# Patient Record
Sex: Female | Born: 1975 | Hispanic: Yes | Marital: Single | Smoking: Never smoker
Health system: Southern US, Community
[De-identification: ages and names within clinical notes are randomized; demographics above are authoritative.]

## PROBLEM LIST (undated history)

## (undated) DIAGNOSIS — R87629 Unspecified abnormal cytological findings in specimens from vagina: Secondary | ICD-10-CM

## (undated) DIAGNOSIS — D649 Anemia, unspecified: Secondary | ICD-10-CM

## (undated) DIAGNOSIS — O24419 Gestational diabetes mellitus in pregnancy, unspecified control: Secondary | ICD-10-CM

## (undated) DIAGNOSIS — Z8659 Personal history of other mental and behavioral disorders: Secondary | ICD-10-CM

## (undated) HISTORY — DX: Personal history of other mental and behavioral disorders: Z86.59

## (undated) HISTORY — DX: Unspecified abnormal cytological findings in specimens from vagina: R87.629

## (undated) HISTORY — DX: Gestational diabetes mellitus in pregnancy, unspecified control: O24.419

## (undated) HISTORY — DX: Anemia, unspecified: D64.9

---

## 2004-04-29 ENCOUNTER — Emergency Department: Payer: Self-pay | Admitting: Emergency Medicine

## 2006-07-19 DIAGNOSIS — O321XX0 Maternal care for breech presentation, not applicable or unspecified: Secondary | ICD-10-CM

## 2009-01-18 LAB — HM HIV SCREENING LAB: HM HIV Screening: NEGATIVE

## 2015-08-09 LAB — HM PAP SMEAR: HM Pap smear: NEGATIVE

## 2019-01-04 ENCOUNTER — Encounter: Payer: Self-pay | Admitting: Emergency Medicine

## 2019-01-04 ENCOUNTER — Emergency Department: Payer: Self-pay

## 2019-01-04 ENCOUNTER — Emergency Department
Admission: EM | Admit: 2019-01-04 | Discharge: 2019-01-04 | Disposition: A | Discharge status: Home / Self Care | Payer: Self-pay | Attending: Emergency Medicine | Admitting: Emergency Medicine

## 2019-01-04 DIAGNOSIS — D649 Anemia, unspecified: Secondary | ICD-10-CM | POA: Insufficient documentation

## 2019-01-04 DIAGNOSIS — E876 Hypokalemia: Secondary | ICD-10-CM | POA: Insufficient documentation

## 2019-01-04 DIAGNOSIS — F410 Panic disorder [episodic paroxysmal anxiety] without agoraphobia: Secondary | ICD-10-CM | POA: Insufficient documentation

## 2019-01-04 LAB — BASIC METABOLIC PANEL
Anion gap: 9 (ref 5–15)
BUN: 12 mg/dL (ref 6–20)
CO2: 24 mmol/L (ref 22–32)
Calcium: 8.7 mg/dL — ABNORMAL LOW (ref 8.9–10.3)
Chloride: 105 mmol/L (ref 98–111)
Creatinine, Ser: 0.53 mg/dL (ref 0.44–1.00)
GFR calc Af Amer: >60 mL/min (ref >60)
GFR calc non Af Amer: >60 mL/min (ref >60)
Glucose, Bld: 128 mg/dL — ABNORMAL HIGH (ref 70–99)
Potassium: 3 mmol/L — ABNORMAL LOW (ref 3.5–5.1)
Sodium: 138 mmol/L (ref 135–145)

## 2019-01-04 LAB — CBC
HCT: 34.6 % — ABNORMAL LOW (ref 36.0–46.0)
Hemoglobin: 11.1 g/dL — ABNORMAL LOW (ref 12.0–15.0)
MCH: 27.3 pg (ref 26.0–34.0)
MCHC: 32.1 g/dL (ref 30.0–36.0)
MCV: 85 fL (ref 80.0–100.0)
Platelets: 313 10*3/uL (ref 150–400)
RBC: 4.07 MIL/uL (ref 3.87–5.11)
RDW: 15.8 % — ABNORMAL HIGH (ref 11.5–15.5)
WBC: 6.1 10*3/uL (ref 4.0–10.5)
nRBC: 0 % (ref 0.0–0.2)

## 2019-01-04 LAB — TROPONIN I (HIGH SENSITIVITY): Troponin I (High Sensitivity): 3 ng/L (ref <18)

## 2019-01-04 MED ORDER — SODIUM CHLORIDE 0.9% FLUSH: 3.0000 mL | Freq: Once | INTRAVENOUS | Status: DC

## 2019-01-04 MED ORDER — IRON 325 (65 FE) MG PO TABS: 1.0000 | ORAL_TABLET | Freq: Every day | ORAL | 1 refills | Status: DC

## 2019-01-04 MED ORDER — POTASSIUM CHLORIDE CRYS ER 20 MEQ PO TBCR
40.0000 meq | EXTENDED_RELEASE_TABLET | Freq: Once | ORAL | Status: AC
  Administered 2019-01-04: 08:00:00 40 meq via ORAL
  Filled 2019-01-04: qty 2
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
## 2019-01-04 NOTE — ED Notes (Signed)
Dr. Veronese at bedside 

## 2019-01-04 NOTE — ED Triage Notes (Signed)
Via the Ipad Spanish interpretor, the patient presents to ED for chest pain located in the central chest without radiation. Says she feels like she can't breathe well. States this happens when she gets upset. When asked if she got upset tonight she stated yes and she is having a little trouble with her partner. Patient denies SI or HI. Patient is without obvious respiratory distress. Able to speak in complete sentences without difficulty. Patient also stated that the spanish interpretor via iPad had "too much Spanish."
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
## 2019-07-06 ENCOUNTER — Ambulatory Visit (LOCAL_COMMUNITY_HEALTH_CENTER): Payer: Self-pay

## 2019-07-06 ENCOUNTER — Other Ambulatory Visit: Payer: Self-pay

## 2019-07-06 VITALS — BP 107/71 | Ht 65.0 in | Wt 157.0 lb

## 2019-07-06 DIAGNOSIS — Z3201 Encounter for pregnancy test, result positive: Secondary | ICD-10-CM

## 2019-07-06 MED ORDER — PRENATAL VITAMIN 27-0.8 MG PO TABS: 1.0000 | ORAL_TABLET | Freq: Every day | ORAL | 0 refills | Status: DC
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
## 2019-07-07 LAB — PREGNANCY, URINE: Preg Test, Ur: POSITIVE — AB

## 2019-08-12 NOTE — Progress Notes (Signed)
Record abstracted per 08/10/19 phone interview Hal Hope, RN; Sharlette Dense, RN

## 2019-08-14 ENCOUNTER — Ambulatory Visit: Payer: Medicaid Other | Admitting: Physician Assistant

## 2019-08-14 ENCOUNTER — Encounter: Payer: Self-pay | Admitting: Physician Assistant

## 2019-08-14 ENCOUNTER — Other Ambulatory Visit: Payer: Self-pay

## 2019-08-14 VITALS — BP 104/65 | HR 75 | Temp 98.6°F | Wt 158.6 lb

## 2019-08-14 DIAGNOSIS — O0990 Supervision of high risk pregnancy, unspecified, unspecified trimester: Secondary | ICD-10-CM | POA: Diagnosis not present

## 2019-08-14 DIAGNOSIS — O09529 Supervision of elderly multigravida, unspecified trimester: Secondary | ICD-10-CM | POA: Insufficient documentation

## 2019-08-14 DIAGNOSIS — Z98891 History of uterine scar from previous surgery: Secondary | ICD-10-CM | POA: Insufficient documentation

## 2019-08-14 DIAGNOSIS — B9689 Other specified bacterial agents as the cause of diseases classified elsewhere: Secondary | ICD-10-CM

## 2019-08-14 DIAGNOSIS — E663 Overweight: Secondary | ICD-10-CM | POA: Insufficient documentation

## 2019-08-14 DIAGNOSIS — O0932 Supervision of pregnancy with insufficient antenatal care, second trimester: Secondary | ICD-10-CM | POA: Insufficient documentation

## 2019-08-14 DIAGNOSIS — Z9189 Other specified personal risk factors, not elsewhere classified: Secondary | ICD-10-CM

## 2019-08-14 DIAGNOSIS — R87629 Unspecified abnormal cytological findings in specimens from vagina: Secondary | ICD-10-CM

## 2019-08-14 DIAGNOSIS — N760 Acute vaginitis: Secondary | ICD-10-CM

## 2019-08-14 DIAGNOSIS — K029 Dental caries, unspecified: Secondary | ICD-10-CM

## 2019-08-14 LAB — WET PREP FOR TRICH, YEAST, CLUE
Trichomonas Exam: NEGATIVE
Yeast Exam: NEGATIVE

## 2019-08-14 LAB — URINALYSIS
Bilirubin, UA: NEGATIVE
Glucose, UA: NEGATIVE
Leukocytes,UA: NEGATIVE
Nitrite, UA: NEGATIVE
Specific Gravity, UA: 1.03 (ref 1.005–1.030)
Urobilinogen, Ur: 0.2 mg/dL (ref 0.2–1.0)
pH, UA: 6 (ref 5.0–7.5)

## 2019-08-14 LAB — HEMOGLOBIN, FINGERSTICK: Hemoglobin: 11.3 g/dL (ref 11.1–15.9)

## 2019-08-14 MED ORDER — METRONIDAZOLE 250 MG PO TABS: 250.0000 mg | ORAL_TABLET | Freq: Three times a day (TID) | ORAL | 0 refills | Status: AC
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
## 2019-08-14 NOTE — Progress Notes (Signed)
Patient wet mount reviewed with provider, patient treated for BV per SO. Patient declines flu vaccine at this time, states she thinks she had it last fall. ROI for C-section Operative notes from 2008 faxed to [facility], and [facility] U/S referral faxed, both with confirmation. Declined quad screen, declination signed.Burt Knack, RN

## 2019-08-15 LAB — CBC/D/PLT+RPR+RH+ABO+RUB AB...
Antibody Screen: NEGATIVE
Basophils Absolute: 0 10*3/uL (ref 0.0–0.2)
Basos: 0 %
EOS (ABSOLUTE): 0.1 10*3/uL (ref 0.0–0.4)
Eos: 1 %
Hematocrit: 34.1 % (ref 34.0–46.6)
Hemoglobin: 11.4 g/dL (ref 11.1–15.9)
Hepatitis B Surface Ag: NEGATIVE
Immature Grans (Abs): 0 10*3/uL (ref 0.0–0.1)
Immature Granulocytes: 0 %
Lymphocytes Absolute: 1.4 10*3/uL (ref 0.7–3.1)
Lymphs: 17 %
MCH: 28.9 pg (ref 26.6–33.0)
MCHC: 33.4 g/dL (ref 31.5–35.7)
MCV: 86 fL (ref 79–97)
Monocytes Absolute: 0.6 10*3/uL (ref 0.1–0.9)
Monocytes: 7 %
Neutrophils Absolute: 6.3 10*3/uL (ref 1.4–7.0)
Neutrophils: 75 %
Platelets: 348 10*3/uL (ref 150–450)
RBC: 3.95 x10E6/uL (ref 3.77–5.28)
RDW: 14.9 % (ref 11.7–15.4)
RPR Ser Ql: NONREACTIVE
Rh Factor: POSITIVE
Rubella Antibodies, IGG: <0.9 index — ABNORMAL LOW (ref >0.99)
Varicella zoster IgG: 329 index (ref >165)
WBC: 8.4 10*3/uL (ref 3.4–10.8)

## 2019-08-15 LAB — HCV AB W REFLEX TO QUANT PCR: HCV Ab: <0.1 s/co ratio (ref 0.0–0.9)

## 2019-08-15 LAB — HIV ANTIBODY (ROUTINE TESTING W REFLEX): HIV Screen 4th Generation wRfx: NONREACTIVE

## 2019-08-15 LAB — HCV INTERPRETATION

## 2019-08-16 LAB — CHLAMYDIA/GC NAA, CONFIRMATION
Chlamydia trachomatis, NAA: NEGATIVE
Neisseria gonorrhoeae, NAA: NEGATIVE

## 2019-08-16 LAB — URINE CULTURE: Organism ID, Bacteria: NO GROWTH

## 2019-08-17 DIAGNOSIS — O09899 Supervision of other high risk pregnancies, unspecified trimester: Secondary | ICD-10-CM | POA: Insufficient documentation

## 2019-08-17 LAB — IGP, APTIMA HPV
HPV Aptima: NEGATIVE
PAP Smear Comment: 0

## 2019-08-17 LAB — HGB FRACTIONATION CASCADE
Hgb A2: 2.6 % (ref 1.8–3.2)
Hgb A: 97.4 % (ref 96.4–98.8)
Hgb F: 0 % (ref 0.0–2.0)
Hgb S: 0 %

## 2019-08-17 LAB — LEAD, BLOOD (ADULT >= 16 YRS): Lead-Whole Blood: <1 ug/dL (ref 0–4)

## 2019-08-18 LAB — QUANTIFERON-TB GOLD PLUS
QuantiFERON Mitogen Value: >10 IU/mL
QuantiFERON Nil Value: 0 IU/mL
QuantiFERON TB1 Ag Value: 0 IU/mL
QuantiFERON TB2 Ag Value: 0 IU/mL
QuantiFERON-TB Gold Plus: NEGATIVE

## 2019-09-04 ENCOUNTER — Encounter: Payer: Self-pay | Admitting: Advanced Practice Midwife

## 2019-09-11 ENCOUNTER — Encounter: Payer: Self-pay | Admitting: Family Medicine

## 2019-09-11 ENCOUNTER — Ambulatory Visit: Payer: Medicaid Other | Admitting: Family Medicine

## 2019-09-11 ENCOUNTER — Other Ambulatory Visit: Payer: Self-pay

## 2019-09-11 VITALS — BP 113/64 | HR 70 | Temp 97.6°F | Wt 167.4 lb

## 2019-09-11 DIAGNOSIS — Z283 Underimmunization status: Secondary | ICD-10-CM

## 2019-09-11 DIAGNOSIS — E663 Overweight: Secondary | ICD-10-CM

## 2019-09-11 DIAGNOSIS — O0990 Supervision of high risk pregnancy, unspecified, unspecified trimester: Secondary | ICD-10-CM

## 2019-09-11 DIAGNOSIS — O09529 Supervision of elderly multigravida, unspecified trimester: Secondary | ICD-10-CM | POA: Diagnosis not present

## 2019-09-11 DIAGNOSIS — Z9189 Other specified personal risk factors, not elsewhere classified: Secondary | ICD-10-CM

## 2019-09-11 DIAGNOSIS — Z98891 History of uterine scar from previous surgery: Secondary | ICD-10-CM

## 2019-09-11 DIAGNOSIS — O99891 Other specified diseases and conditions complicating pregnancy: Secondary | ICD-10-CM

## 2019-09-11 DIAGNOSIS — N898 Other specified noninflammatory disorders of vagina: Secondary | ICD-10-CM

## 2019-09-11 DIAGNOSIS — Z2839 Other underimmunization status: Secondary | ICD-10-CM

## 2019-09-11 LAB — WET PREP FOR TRICH, YEAST, CLUE: Trichomonas Exam: NEGATIVE

## 2019-09-11 MED ORDER — CLOTRIMAZOLE 1 % VA CREA: 1.0000 | TOPICAL_CREAM | Freq: Every day | VAGINAL | 0 refills | Status: AC

## 2019-09-11 MED ORDER — PRENATAL VITAMINS 28-0.8 MG PO TABS: 1.0000 | ORAL_TABLET | Freq: Every day | ORAL | 0 refills | Status: DC
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
## 2019-09-11 NOTE — Progress Notes (Signed)
In for visit ;taking PNV (more given today); aware of 09/15/19 [facility] u/s; [facility] pager# given; counseled re: rubella non-immune & vaccine pp; denies hospital visit since last appt; OBCM R. Marlan Palau, in to see client Sharlette Dense, RN Wet prep reviewed-+Yeast treated per standing order Sharlette Dense, RN

## 2019-09-16 LAB — CHLAMYDIA/GC NAA, CONFIRMATION
Chlamydia trachomatis, NAA: NEGATIVE
Neisseria gonorrhoeae, NAA: NEGATIVE

## 2019-10-09 ENCOUNTER — Encounter: Payer: Self-pay | Admitting: Family Medicine

## 2019-10-09 ENCOUNTER — Other Ambulatory Visit: Payer: Self-pay

## 2019-10-09 ENCOUNTER — Ambulatory Visit: Payer: Self-pay | Admitting: Family Medicine

## 2019-10-09 VITALS — BP 98/60 | HR 77 | Temp 97.7°F | Wt 172.8 lb

## 2019-10-09 DIAGNOSIS — Z98891 History of uterine scar from previous surgery: Secondary | ICD-10-CM

## 2019-10-09 DIAGNOSIS — Z283 Underimmunization status: Secondary | ICD-10-CM

## 2019-10-09 DIAGNOSIS — O0990 Supervision of high risk pregnancy, unspecified, unspecified trimester: Secondary | ICD-10-CM

## 2019-10-09 DIAGNOSIS — O99891 Other specified diseases and conditions complicating pregnancy: Secondary | ICD-10-CM

## 2019-10-09 DIAGNOSIS — O0932 Supervision of pregnancy with insufficient antenatal care, second trimester: Secondary | ICD-10-CM

## 2019-10-09 DIAGNOSIS — Z2839 Other underimmunization status: Secondary | ICD-10-CM

## 2019-10-09 DIAGNOSIS — O09529 Supervision of elderly multigravida, unspecified trimester: Secondary | ICD-10-CM
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
## 2019-10-09 NOTE — Progress Notes (Signed)
In for visit; taking PNV; denies hospital visits since last appt; discussed labs needed @ next visit Sharlette Dense, RN

## 2019-11-06 ENCOUNTER — Ambulatory Visit: Payer: Self-pay

## 2019-11-12 ENCOUNTER — Other Ambulatory Visit: Payer: Self-pay

## 2019-11-12 ENCOUNTER — Ambulatory Visit: Payer: Self-pay | Admitting: Physician Assistant

## 2019-11-12 ENCOUNTER — Encounter: Payer: Self-pay | Admitting: Physician Assistant

## 2019-11-12 VITALS — BP 105/64 | HR 77 | Temp 97.9°F | Wt 183.2 lb

## 2019-11-12 DIAGNOSIS — E663 Overweight: Secondary | ICD-10-CM

## 2019-11-12 DIAGNOSIS — Z98891 History of uterine scar from previous surgery: Secondary | ICD-10-CM

## 2019-11-12 DIAGNOSIS — O0990 Supervision of high risk pregnancy, unspecified, unspecified trimester: Secondary | ICD-10-CM

## 2019-11-12 NOTE — Progress Notes (Signed)
Pt denies visit to ER since last appt at [facility]. Pt is taking PNV. Pt refuses Tdap vaccine; pt accepts Tdap information sheet. Reviewed kick counts and card provided. Pt completed CCNC and PHQ9 forms. List of pediatricians provided to pt. Pt scheduled for 28 week labs in RN clinic for 11/16/19.
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
## 2019-11-16 ENCOUNTER — Other Ambulatory Visit: Payer: Self-pay

## 2019-11-16 DIAGNOSIS — O0990 Supervision of high risk pregnancy, unspecified, unspecified trimester: Secondary | ICD-10-CM

## 2019-11-16 MED ORDER — FERROUS SULFATE 324 (65 FE) MG PO TBEC: 324.0000 mg | DELAYED_RELEASE_TABLET | Freq: Three times a day (TID) | ORAL | 0 refills | Status: DC

## 2019-11-16 NOTE — Progress Notes (Signed)
Patient here for 28 weeks labs. Draw time for 1hr glucose is 4:28pm Richmond Campbell, RN   Hgb reveiwed; anemia profile ordered and patient given Iron 325mg  1 po TID per SO. Instructed to take with OJ and 2 hours apart from PNV.  Co also to increase fluids and fier d/t possible constipation. Patient verbalized understanding , RN

## 2019-11-17 ENCOUNTER — Telehealth: Payer: Self-pay

## 2019-11-17 DIAGNOSIS — O24419 Gestational diabetes mellitus in pregnancy, unspecified control: Secondary | ICD-10-CM | POA: Insufficient documentation

## 2019-11-17 DIAGNOSIS — O99013 Anemia complicating pregnancy, third trimester: Secondary | ICD-10-CM | POA: Insufficient documentation

## 2019-11-17 LAB — FE+CBC/D/PLT+TIBC+FER+RETIC
Basophils Absolute: 0 10*3/uL (ref 0.0–0.2)
Basos: 0 %
EOS (ABSOLUTE): 0.1 10*3/uL (ref 0.0–0.4)
Eos: 1 %
Ferritin: 6 ng/mL — ABNORMAL LOW (ref 15–150)
Hematocrit: 27.7 % — ABNORMAL LOW (ref 34.0–46.6)
Hemoglobin: 9.3 g/dL — ABNORMAL LOW (ref 11.1–15.9)
Immature Grans (Abs): 0 10*3/uL (ref 0.0–0.1)
Immature Granulocytes: 1 %
Iron Saturation: 3 % — CL (ref 15–55)
Iron: 15 ug/dL — ABNORMAL LOW (ref 27–159)
Lymphocytes Absolute: 1.2 10*3/uL (ref 0.7–3.1)
Lymphs: 18 %
MCH: 28.6 pg (ref 26.6–33.0)
MCHC: 33.6 g/dL (ref 31.5–35.7)
MCV: 85 fL (ref 79–97)
Monocytes Absolute: 0.3 10*3/uL (ref 0.1–0.9)
Monocytes: 5 %
Neutrophils Absolute: 4.8 10*3/uL (ref 1.4–7.0)
Neutrophils: 75 %
Platelets: 319 10*3/uL (ref 150–450)
RBC: 3.25 x10E6/uL — ABNORMAL LOW (ref 3.77–5.28)
RDW: 13.8 % (ref 11.7–15.4)
Retic Ct Pct: 2.2 % (ref 0.6–2.6)
Total Iron Binding Capacity: 558 ug/dL (ref 250–450)
UIBC: 543 ug/dL — ABNORMAL HIGH (ref 131–425)
WBC: 6.4 10*3/uL (ref 3.4–10.8)

## 2019-11-17 LAB — RPR: RPR Ser Ql: NONREACTIVE

## 2019-11-17 LAB — HIV ANTIBODY (ROUTINE TESTING W REFLEX): HIV Screen 4th Generation wRfx: NONREACTIVE

## 2019-11-17 LAB — HEMOGLOBIN: Hemoglobin: 9.3 g/dL — ABNORMAL LOW (ref 11.1–15.9)

## 2019-11-17 LAB — GLUCOSE, 1 HOUR GESTATIONAL: Gestational Diabetes Screen: 182 mg/dL — ABNORMAL HIGH (ref 65–139)

## 2019-11-17 NOTE — Telephone Encounter (Signed)
One hour glucola test result = 182 and client needs 3 hour GTT. Call to client with [facility] [employer] (ID # [phone]) and left message need to discuss diabetes testing result and need for additional testing. Number to call provided. Jossie Ng, RN

## 2019-11-18 NOTE — Telephone Encounter (Signed)
TC to patient to inform of elevated 1 hour gtt and to scheduled 3 hour gtt. LM for patient to return call asap. Interpreter V. Olmedo.Burt Knack, RN

## 2019-11-19 ENCOUNTER — Telehealth: Payer: Self-pay

## 2019-11-19 NOTE — Telephone Encounter (Signed)
Patient returned call and counseled on need for 3 hr gtt. Due to elevated 1 hr gtt. Patient counseled on fasting for 12 hours before test and when to stop eating and when to arrive for test. Patient scheduled for 11/20/2019 and told to arrive at 8:00am. Patient counseled she will need to be here for most of the morning for the scheduled blood draws 1 hour apart. Patient states understanding about fasting and time required for test. Patient questions answered and patient states understanding of plan. Interpreter V. Olmedo.Burt Knack, RN

## 2019-11-20 ENCOUNTER — Other Ambulatory Visit: Payer: Self-pay

## 2019-11-20 DIAGNOSIS — O99810 Abnormal glucose complicating pregnancy: Secondary | ICD-10-CM

## 2019-11-20 NOTE — Progress Notes (Signed)
Patient in nurse clinic today for 3 hour GTT. Instructions given, patient sent to lab. No history of travel noted. Questions answered. Reports understanding. M. Yemen, interpreter today. Jerel Shepherd, RN

## 2019-11-21 LAB — GLUCOSE TOLERANCE TEST, 6 HOUR
Glucose, 1 Hour GTT: 182 mg/dL (ref 65–199)
Glucose, 2 hour: 155 mg/dL — ABNORMAL HIGH (ref 65–139)
Glucose, 3 hour: 114 mg/dL — ABNORMAL HIGH (ref 65–109)
Glucose, GTT - Fasting: 81 mg/dL (ref 65–99)

## 2019-11-23 ENCOUNTER — Telehealth: Payer: Self-pay | Admitting: Family Medicine

## 2019-11-23 ENCOUNTER — Encounter: Payer: Self-pay | Admitting: Family Medicine

## 2019-11-23 DIAGNOSIS — O24410 Gestational diabetes mellitus in pregnancy, diet controlled: Secondary | ICD-10-CM

## 2019-11-23 MED ORDER — BLOOD GLUCOSE METER KIT: PACK | 0 refills | Status: DC

## 2019-11-23 NOTE — Telephone Encounter (Addendum)
I left a message on pt's phone to inform her of new dx of GDM. I have referred pt to [facility] for GDM education and glucometer.  Pt has been added to "OB Pt list." Glucometer kit ordered, plan for [facility] to provide.

## 2019-11-26 ENCOUNTER — Other Ambulatory Visit: Payer: Self-pay

## 2019-11-26 ENCOUNTER — Encounter: Payer: Self-pay | Admitting: Physician Assistant

## 2019-11-26 ENCOUNTER — Ambulatory Visit: Payer: Self-pay | Admitting: Physician Assistant

## 2019-11-26 VITALS — BP 111/68 | HR 81 | Temp 97.1°F | Wt 182.6 lb

## 2019-11-26 DIAGNOSIS — O09899 Supervision of other high risk pregnancies, unspecified trimester: Secondary | ICD-10-CM

## 2019-11-26 DIAGNOSIS — O24410 Gestational diabetes mellitus in pregnancy, diet controlled: Secondary | ICD-10-CM

## 2019-11-26 DIAGNOSIS — O99891 Other specified diseases and conditions complicating pregnancy: Secondary | ICD-10-CM

## 2019-11-26 DIAGNOSIS — Z283 Underimmunization status: Secondary | ICD-10-CM

## 2019-11-26 DIAGNOSIS — Z3490 Encounter for supervision of normal pregnancy, unspecified, unspecified trimester: Secondary | ICD-10-CM

## 2019-11-26 DIAGNOSIS — O0990 Supervision of high risk pregnancy, unspecified, unspecified trimester: Secondary | ICD-10-CM

## 2019-11-26 DIAGNOSIS — E663 Overweight: Secondary | ICD-10-CM

## 2019-11-26 DIAGNOSIS — O99013 Anemia complicating pregnancy, third trimester: Secondary | ICD-10-CM

## 2019-11-26 DIAGNOSIS — Z98891 History of uterine scar from previous surgery: Secondary | ICD-10-CM

## 2019-11-26 LAB — URINALYSIS
Bilirubin, UA: NEGATIVE
Glucose, UA: NEGATIVE
Nitrite, UA: NEGATIVE
Protein,UA: NEGATIVE
Specific Gravity, UA: 1.03 (ref 1.005–1.030)
Urobilinogen, Ur: 0.2 mg/dL (ref 0.2–1.0)
pH, UA: 5.5 (ref 5.0–7.5)

## 2019-11-26 NOTE — Progress Notes (Addendum)
Taking PNV and iron tablet daily. Encouraged to take PNV and iron several hours apart. Enouraged to take iron with small sip of juice due to GDM. Aware of [facility] MFM consult appt 12/11/2019 at 1015. Jossie Ng, RN  Urine dip results reviewed by A. Streilein PA-C. [facility] TOLAC referral with VBAC percentage, demographic info, C-section operative report and records faxed to [facility] with fax confirmation received. Jossie Ng, RN
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
## 2019-12-01 ENCOUNTER — Telehealth: Payer: Self-pay

## 2019-12-01 DIAGNOSIS — Z98891 History of uterine scar from previous surgery: Secondary | ICD-10-CM

## 2019-12-01 NOTE — Telephone Encounter (Signed)
TC from [place] at [facility] referrals. She states patient's records were reviewed by Dr. Cherly Hensen and patient does not need a formal OB consult for delivery and can choose to pursue TOLAC or C-section. [facility] note printed for scanning.Burt Knack, RN

## 2019-12-10 ENCOUNTER — Ambulatory Visit: Payer: Self-pay | Admitting: Physician Assistant

## 2019-12-10 ENCOUNTER — Encounter: Payer: Self-pay | Admitting: Physician Assistant

## 2019-12-10 ENCOUNTER — Other Ambulatory Visit: Payer: Self-pay

## 2019-12-10 VITALS — BP 109/60 | HR 72 | Temp 97.5°F | Wt 186.8 lb

## 2019-12-10 DIAGNOSIS — O99013 Anemia complicating pregnancy, third trimester: Secondary | ICD-10-CM

## 2019-12-10 DIAGNOSIS — O09529 Supervision of elderly multigravida, unspecified trimester: Secondary | ICD-10-CM

## 2019-12-10 DIAGNOSIS — O24419 Gestational diabetes mellitus in pregnancy, unspecified control: Secondary | ICD-10-CM

## 2019-12-10 DIAGNOSIS — O0990 Supervision of high risk pregnancy, unspecified, unspecified trimester: Secondary | ICD-10-CM

## 2019-12-10 DIAGNOSIS — Z98891 History of uterine scar from previous surgery: Secondary | ICD-10-CM

## 2019-12-10 DIAGNOSIS — Z9189 Other specified personal risk factors, not elsewhere classified: Secondary | ICD-10-CM

## 2019-12-10 LAB — URINALYSIS
Bilirubin, UA: NEGATIVE
Glucose, UA: NEGATIVE
Ketones, UA: NEGATIVE
Nitrite, UA: NEGATIVE
Protein,UA: NEGATIVE
Specific Gravity, UA: 1.025 (ref 1.005–1.030)
Urobilinogen, Ur: 0.2 mg/dL (ref 0.2–1.0)
pH, UA: 6 (ref 5.0–7.5)

## 2019-12-10 NOTE — Progress Notes (Signed)
Urine dip reviewed by provider, no new orders. Patient given map for [place] [facility] for tomorrow's appointment at [facility]. Patient states she will plan to arrive 15 minutes early. [facility] U/S referral faxed with confirmation, patient aware to expect call from [facility] to set up appointment.Burt Knack, RN

## 2019-12-10 NOTE — Progress Notes (Signed)
Patient here for MH RV at 35 1/7. She is aware of [facility] MFM appointment tomorrow and is not yet checking her BS. Needs urine dip today. Patient states she is having a girl. Peds list given and patient urged to choose a pediatrician asap. Patient states she is living with husband and feels safe at this time.Burt Knack, RN
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
## 2019-12-15 NOTE — Progress Notes (Signed)
Per [facility], repeat US (ordered by [facility] 12/11/2019) scheduled for 12/23/2019 at 1000 ([place]). Client also has video appt with K. Saxer CNM on 10/25 at 1630 and video appt with RD/LDN in nutrition services on 12/31/2019 at 1 pm. Jossie Ng, RN

## 2019-12-17 ENCOUNTER — Other Ambulatory Visit: Payer: Self-pay

## 2019-12-17 ENCOUNTER — Ambulatory Visit: Payer: Self-pay | Admitting: Physician Assistant

## 2019-12-17 ENCOUNTER — Encounter: Payer: Self-pay | Admitting: Physician Assistant

## 2019-12-17 VITALS — BP 115/63 | HR 65 | Temp 97.4°F | Wt 184.2 lb

## 2019-12-17 DIAGNOSIS — O99013 Anemia complicating pregnancy, third trimester: Secondary | ICD-10-CM

## 2019-12-17 DIAGNOSIS — O24410 Gestational diabetes mellitus in pregnancy, diet controlled: Secondary | ICD-10-CM

## 2019-12-17 DIAGNOSIS — O0990 Supervision of high risk pregnancy, unspecified, unspecified trimester: Secondary | ICD-10-CM

## 2019-12-17 LAB — URINALYSIS
Bilirubin, UA: NEGATIVE
Glucose, UA: NEGATIVE
Ketones, UA: NEGATIVE
Nitrite, UA: NEGATIVE
Protein,UA: NEGATIVE
Specific Gravity, UA: 1.025 (ref 1.005–1.030)
Urobilinogen, Ur: 0.2 mg/dL (ref 0.2–1.0)
pH, UA: 6 (ref 5.0–7.5)

## 2019-12-17 LAB — HEMOGLOBIN, FINGERSTICK: Hemoglobin: 10.6 g/dL — ABNORMAL LOW (ref 11.1–15.9)

## 2019-12-17 MED ORDER — FERROUS SULFATE 324 (65 FE) MG PO TBEC: 1.0000 | DELAYED_RELEASE_TABLET | Freq: Three times a day (TID) | ORAL | 0 refills | Status: DC

## 2019-12-17 NOTE — Progress Notes (Signed)
Patient here for MH RV at 36 1/7. Needs 36 week labs and packet today. Also needs Hgb and urine dip. Patient has [facility] MFM appt on 12/21/19, [facility] U/S on 12/23/19 and [facility] video appt with RN on 12/31/19. Patient states she needs more iron tablets today.Burt Knack, RN
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
## 2019-12-17 NOTE — Progress Notes (Signed)
FeSO4 tablets given today, patient counseled to continue to take with juice 3x/day, per provider recommendation. Urine dip reviewed with provider, patient prefers provider to collect labs today. 36 week packet reviewed with patient.Burt Knack, RN

## 2019-12-19 LAB — CHLAMYDIA/GC NAA, CONFIRMATION
Chlamydia trachomatis, NAA: NEGATIVE
Neisseria gonorrhoeae, NAA: NEGATIVE

## 2019-12-20 LAB — CULTURE, BETA STREP (GROUP B ONLY): Strep Gp B Culture: POSITIVE — AB

## 2019-12-21 DIAGNOSIS — B951 Streptococcus, group B, as the cause of diseases classified elsewhere: Secondary | ICD-10-CM | POA: Insufficient documentation

## 2019-12-24 ENCOUNTER — Ambulatory Visit: Payer: Self-pay | Admitting: Physician Assistant

## 2019-12-24 ENCOUNTER — Other Ambulatory Visit: Payer: Self-pay

## 2019-12-24 VITALS — BP 108/65 | HR 82 | Temp 98.2°F | Wt 183.4 lb

## 2019-12-24 DIAGNOSIS — O24410 Gestational diabetes mellitus in pregnancy, diet controlled: Secondary | ICD-10-CM

## 2019-12-24 DIAGNOSIS — O09529 Supervision of elderly multigravida, unspecified trimester: Secondary | ICD-10-CM

## 2019-12-24 DIAGNOSIS — O99013 Anemia complicating pregnancy, third trimester: Secondary | ICD-10-CM

## 2019-12-24 DIAGNOSIS — O0990 Supervision of high risk pregnancy, unspecified, unspecified trimester: Secondary | ICD-10-CM

## 2019-12-24 LAB — URINALYSIS
Bilirubin, UA: NEGATIVE
Glucose, UA: NEGATIVE
Ketones, UA: NEGATIVE
Leukocytes,UA: NEGATIVE
Nitrite, UA: NEGATIVE
Specific Gravity, UA: 1.03 (ref 1.005–1.030)
Urobilinogen, Ur: 0.2 mg/dL (ref 0.2–1.0)
pH, UA: 6 (ref 5.0–7.5)

## 2019-12-24 NOTE — Progress Notes (Addendum)
Correctly verbalizes how to take iron (BID) and PNV. Taking iron BID with water as avoiding juice due to GDM. Per client, was told during phone appt this week that no longer had to do blood sugar checks. Jossie Ng, RN  Urine dip reviewed by A. Streilein PA-C and no new orders given. [facility] referral for weekly AFI and NST until delivery due to GDM faxed with [employer] and fax confirmation received. [facility] scheduler will notify client of appt via phone call and client aware. Jossie Ng, RN  Per [facility], client has following appts for BPP at [facility]: 12/28/19 1530, 01/04/20 1330, 01/11/20 1300 and 01/18/20 1300. Jossie Ng, RN
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
## 2019-12-31 ENCOUNTER — Encounter: Payer: Self-pay | Admitting: Family Medicine

## 2019-12-31 ENCOUNTER — Other Ambulatory Visit: Payer: Self-pay

## 2019-12-31 ENCOUNTER — Ambulatory Visit: Payer: Self-pay | Admitting: Family Medicine

## 2019-12-31 VITALS — BP 114/69 | HR 65 | Temp 97.6°F | Wt 182.4 lb

## 2019-12-31 DIAGNOSIS — O0993 Supervision of high risk pregnancy, unspecified, third trimester: Secondary | ICD-10-CM

## 2019-12-31 DIAGNOSIS — O0990 Supervision of high risk pregnancy, unspecified, unspecified trimester: Secondary | ICD-10-CM

## 2019-12-31 DIAGNOSIS — E663 Overweight: Secondary | ICD-10-CM

## 2019-12-31 DIAGNOSIS — O99013 Anemia complicating pregnancy, third trimester: Secondary | ICD-10-CM

## 2019-12-31 DIAGNOSIS — O24419 Gestational diabetes mellitus in pregnancy, unspecified control: Secondary | ICD-10-CM

## 2019-12-31 LAB — URINALYSIS
Bilirubin, UA: NEGATIVE
Glucose, UA: NEGATIVE
Ketones, UA: NEGATIVE
Nitrite, UA: NEGATIVE
Specific Gravity, UA: 1.03 (ref 1.005–1.030)
Urobilinogen, Ur: 0.2 mg/dL (ref 0.2–1.0)
pH, UA: 6 (ref 5.0–7.5)

## 2019-12-31 NOTE — Progress Notes (Signed)
Urine dip reviewed by provider, A. Streilein, no new orders.Burt Knack, RN
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
## 2019-12-31 NOTE — Progress Notes (Signed)
Patient here for MH RV at 38 1/7. Patient had video visit with [facility] RD today. Aware of BPP on 01/04/2020. Patient states she has been checking her sugars and states "they look the same as the others", but she rushed out of the house and forgot her BS log.Burt Knack, RN

## 2020-01-04 ENCOUNTER — Encounter: Payer: Self-pay | Admitting: Family Medicine

## 2020-01-05 ENCOUNTER — Encounter: Payer: Self-pay | Admitting: Family Medicine
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
## 2020-01-08 ENCOUNTER — Telehealth: Payer: Self-pay

## 2020-01-08 ENCOUNTER — Ambulatory Visit: Payer: Self-pay | Admitting: Family Medicine

## 2020-01-08 ENCOUNTER — Other Ambulatory Visit: Payer: Self-pay

## 2020-01-08 VITALS — BP 117/73 | HR 67 | Temp 97.5°F | Wt 186.6 lb

## 2020-01-08 DIAGNOSIS — O0990 Supervision of high risk pregnancy, unspecified, unspecified trimester: Secondary | ICD-10-CM

## 2020-01-08 DIAGNOSIS — E663 Overweight: Secondary | ICD-10-CM

## 2020-01-08 DIAGNOSIS — O24410 Gestational diabetes mellitus in pregnancy, diet controlled: Secondary | ICD-10-CM

## 2020-01-08 DIAGNOSIS — O09529 Supervision of elderly multigravida, unspecified trimester: Secondary | ICD-10-CM

## 2020-01-08 LAB — URINALYSIS
Bilirubin, UA: NEGATIVE
Glucose, UA: NEGATIVE
Ketones, UA: NEGATIVE
Nitrite, UA: NEGATIVE
Specific Gravity, UA: 1.03 (ref 1.005–1.030)
Urobilinogen, Ur: 0.2 mg/dL (ref 0.2–1.0)
pH, UA: 6 (ref 5.0–7.5)

## 2020-01-08 NOTE — Telephone Encounter (Signed)
[facility] IOL referral faxed with confirmation received. Call to [facility] to schedule and left voicemail message regarding age of client, GDM and provider recommendation to deliver on EDD of 01/13/2020. [facility] number to call provided. Jossie Ng, RN

## 2020-01-08 NOTE — Progress Notes (Signed)
Blood glucose testing results per client copied and in outguide for provider review. Urine dip reviewed by Elveria Rising NP. Per [facility] lab, sufficient urine remaining from dip that spot and culture can be added. Paper orders taken to lab. Jossie Ng, RN  [facility] referral for IOL faxed with fax confirmation received. Jossie Ng, RN
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
## 2020-01-09 LAB — PROTEIN / CREATININE RATIO, URINE
Creatinine, Urine: 171.7 mg/dL
Protein, Ur: 68.6 mg/dL
Protein/Creat Ratio: 400 mg/g creat — ABNORMAL HIGH (ref 0–200)

## 2020-01-10 LAB — URINE CULTURE

## 2020-01-11 ENCOUNTER — Telehealth: Payer: Self-pay

## 2020-01-11 NOTE — Telephone Encounter (Signed)
As no response yet this am from message left on voicemail of Patrice - [facility] L& D scheduler, call made to her number and left message to call regarding IOL for client that is recommended to be induced 01/13/2020. RN left her number for [facility]. Jossie Ng, RN

## 2020-01-11 NOTE — Telephone Encounter (Signed)
Call to [facility] Rima & D scheduler and IOL scheduled for 01/13/2020. [facility] will notify client of arrival time via phone call that day between 7 am - 11 am. Call to client (who was enroute to 1 pm BPP appt) with above information using [facility] Interpre[employer] ID # [id number]. Jossie Ng, RN

## 2020-01-11 NOTE — Telephone Encounter (Signed)
Phone encounter opened in error. Rohaan Durnil, RN  

## 2020-01-15 ENCOUNTER — Ambulatory Visit: Payer: Self-pay

## 2020-01-25 ENCOUNTER — Telehealth: Payer: Self-pay | Admitting: Licensed Clinical Social Worker

## 2020-01-25 NOTE — Telephone Encounter (Signed)
TELEHEALTH VIRTUAL MOOD VISIT ENCOUNTER NOTE  I connected with Brianna Pratt on 01/25/20 by telephone and verified that I am speaking with the correct person using two identifiers.   I discussed the limitations, risks, security and privacy concerns of performing an evaluation and management service by telephone and the availability of in person appointments. I also discussed with the patient that there may be a patient responsible charge related to this service. The patient expressed understanding and agreed to proceed.  LCSW introduced self and established psychological safety.  Patient verbally consented to this telephonic session.   Patient is located at home. LCSW located at remote work location.   Time visit started: 3:33pm  Time visit ended: 4:01  SUBJECTIVE: Patient clinic or provider recommended a 2 week mood check due to risk factors for postpartum mood disorder.  Patient delivered on 01/13/20.  ASSESSMENT: Patient currently denies any current symptoms of postpartum depression or anxiety. She reports that she has been fine. She reports that delivery went well. Appetite is well, sleeping well- able to sleep when baby sleeps. She does report not breastfeeding- it did not work out and she is formula feeding.  Patient may benefit from [facility]. LCSW referred to [facility] via voicemail.   PRESENTING CONCERNS: Patient and/or family reports the following symptoms/concerns: Patient reports concern about taking baby to doctor appointment due to not being able to pay and not having insurance. LCSW encouraged patient to talk with Brianna Pratt about this. And encouraged her to talk with Brianna Pratt about getting baby an appointment.   STRENGTHS (Protective Factors/Coping Skills): -One friend that provides support.   INTERVENTIONS:  Interventions utilized:  Psychoeducation and/or Health Education Standardized Assessments completed: Not  Needed  . Conducted brief assessment  . Provide brief psychoeducation on postpartum mood and anxiety disorders signs and symptoms, including information on Baby Blues, Postpartum Depression, and Postpartum Anxiety.   . Provided resource information on [facility] breastfeeding support  . Provided contact information to [employer]  . Discussed self-care strategies to prevent and/or reduce symptoms of postpartum mood and anxiety disorders, including sleep hygiene, eating regularly, drinking fluids, getting outside, and support from partner, family, and/or friends.   Informed patient to call her provider right away or get emergency help if she experiences any of the following symptoms: . Feelings of hopelessness and total despair. . Feeling out of touch with reality (hearing or seeing things other people don't). . Feeling that you might hurt yourself or your baby.  PLAN: 1. LCSW will refer patient to Brianna Pratt, [employer]. Left vm for Brianna Pratt.  2. LCSW referred patient to [facility] - for breastfeeding support - spoke with Brianna Pratt in [facility]  Brianna Pratt, Brianna Pratt   Brianna Pratt
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
## 2020-01-27 ENCOUNTER — Ambulatory Visit: Payer: Self-pay | Admitting: Advanced Practice Midwife

## 2020-01-27 ENCOUNTER — Other Ambulatory Visit: Payer: Self-pay

## 2020-01-27 DIAGNOSIS — O24410 Gestational diabetes mellitus in pregnancy, diet controlled: Secondary | ICD-10-CM

## 2020-01-27 DIAGNOSIS — O9081 Anemia of the puerperium: Secondary | ICD-10-CM

## 2020-01-27 DIAGNOSIS — Z8632 Personal history of gestational diabetes: Secondary | ICD-10-CM | POA: Insufficient documentation

## 2020-01-27 DIAGNOSIS — O24419 Gestational diabetes mellitus in pregnancy, unspecified control: Secondary | ICD-10-CM | POA: Insufficient documentation

## 2020-01-27 DIAGNOSIS — O09529 Supervision of elderly multigravida, unspecified trimester: Secondary | ICD-10-CM

## 2020-01-27 DIAGNOSIS — E663 Overweight: Secondary | ICD-10-CM

## 2020-01-27 LAB — HEMOGLOBIN, FINGERSTICK: Hemoglobin: 8.8 g/dL — ABNORMAL LOW (ref 11.1–15.9)

## 2020-01-27 MED ORDER — FERROUS SULFATE 324 (65 FE) MG PO TBEC: 1.0000 | DELAYED_RELEASE_TABLET | Freq: Three times a day (TID) | ORAL | 0 refills | Status: DC
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
## 2020-01-27 NOTE — Progress Notes (Signed)
Vitals taken by [facility]: 156#, BP 133/72, pulse 84 and oral temp 97.6. Jossie Ng, RN

## 2020-01-27 NOTE — Progress Notes (Signed)
Presents for 2 wk PP checkup. Hgb 8.8, given iron, to take TID per provider with OJ. 6 wk PP apt scheduled, counseled to fast for a minimum for 8 hour prior to arrival to 02/25/20 appointment for 2hr gtt. Patient verbalized understanding. Sharlyne Pacas, RN

## 2020-02-25 ENCOUNTER — Other Ambulatory Visit: Payer: Self-pay

## 2020-02-25 ENCOUNTER — Ambulatory Visit: Payer: Self-pay

## 2020-03-03 ENCOUNTER — Ambulatory Visit (LOCAL_COMMUNITY_HEALTH_CENTER): Payer: Self-pay | Admitting: Physician Assistant

## 2020-03-03 ENCOUNTER — Encounter: Payer: Self-pay | Admitting: Physician Assistant

## 2020-03-03 ENCOUNTER — Other Ambulatory Visit: Payer: Self-pay

## 2020-03-03 VITALS — BP 123/82 | Wt 156.0 lb

## 2020-03-03 DIAGNOSIS — Z0000 Encounter for general adult medical examination without abnormal findings: Secondary | ICD-10-CM

## 2020-03-03 DIAGNOSIS — Z9189 Other specified personal risk factors, not elsewhere classified: Secondary | ICD-10-CM

## 2020-03-03 DIAGNOSIS — Z30013 Encounter for initial prescription of injectable contraceptive: Secondary | ICD-10-CM

## 2020-03-03 DIAGNOSIS — Z3009 Encounter for other general counseling and advice on contraception: Secondary | ICD-10-CM

## 2020-03-03 DIAGNOSIS — Z8632 Personal history of gestational diabetes: Secondary | ICD-10-CM

## 2020-03-03 MED ORDER — MEDROXYPROGESTERONE ACETATE 150 MG/ML IM SUSP
150.0000 mg | INTRAMUSCULAR | Status: AC
  Administered 2020-03-03 – 2020-11-04 (×4): 150 mg via INTRAMUSCULAR
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
## 2020-03-04 ENCOUNTER — Encounter: Payer: Self-pay | Admitting: Physician Assistant

## 2020-03-04 LAB — GLUCOSE TOLERANCE, 2 HOURS
Glucose, 2 hour: 102 mg/dL (ref 65–139)
Glucose, GTT - Fasting: 101 mg/dL — ABNORMAL HIGH (ref 65–99)
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
## 2020-03-07 ENCOUNTER — Telehealth: Payer: Self-pay

## 2020-03-07 NOTE — Telephone Encounter (Signed)
Call to client and left message regarding 2 hour GTT results and recommendation as per Sadie Haber PA (attached to 2 hour GTT lab results). Client counseled to call if has any questions and number to call provided. [employer] ID # [id number] assisted with call. Jossie Ng, RN

## 2020-03-10 ENCOUNTER — Telehealth: Payer: Self-pay

## 2020-03-10 NOTE — Telephone Encounter (Signed)
Call to patient with interpreter Marlene Yemen. RN notified patient  2hr gtt and fasting glucose results. Patient has established care at [facility] and instructed to schedule appt for follow up. Patient verbalized understanding.   Harvie Heck, RN

## 2020-03-10 NOTE — Telephone Encounter (Signed)
-----   Message from Matt Holmes, [place] sent at 03/04/2020  5:12 PM EST ----- Fasting glucose is elevated and 2 hr glucose is normal.  Please call patient and let her know that it is very important that she follow up with a PCP as we discussed because of her risk of developing diabetes in the future.  The patient does not have my chart to send her the message.

## 2020-03-10 NOTE — Telephone Encounter (Signed)
Call to patient with interpreter Marlene Yemen in regards patients glucose test results from 03/04/2019. No answer, LMOM to return call.   Harvie Heck, RN

## 2020-03-23 ENCOUNTER — Ambulatory Visit: Payer: Self-pay | Admitting: Licensed Clinical Social Worker

## 2020-05-19 ENCOUNTER — Ambulatory Visit: Payer: Self-pay

## 2020-06-01 ENCOUNTER — Ambulatory Visit (LOCAL_COMMUNITY_HEALTH_CENTER): Payer: Self-pay

## 2020-06-01 ENCOUNTER — Other Ambulatory Visit: Payer: Self-pay

## 2020-06-01 VITALS — BP 115/73 | Wt 163.5 lb

## 2020-06-01 DIAGNOSIS — Z30013 Encounter for initial prescription of injectable contraceptive: Secondary | ICD-10-CM

## 2020-06-01 DIAGNOSIS — Z3042 Encounter for surveillance of injectable contraceptive: Secondary | ICD-10-CM

## 2020-06-01 DIAGNOSIS — Z3009 Encounter for other general counseling and advice on contraception: Secondary | ICD-10-CM

## 2020-06-01 NOTE — Progress Notes (Signed)
12 weeks 6 days post depo. Voices no concerns. Depo given today per order by C. Rolley Sims, [place] dated 03/03/2020, tolerated well L Delt. Next depo due 08/17/2020, pt aware. Lang. Line, interpreter. Jerel Shepherd, RN

## 2020-08-18 ENCOUNTER — Ambulatory Visit (LOCAL_COMMUNITY_HEALTH_CENTER): Payer: Self-pay

## 2020-08-18 ENCOUNTER — Other Ambulatory Visit: Payer: Self-pay

## 2020-08-18 VITALS — BP 118/67 | HR 75 | Ht 65.0 in | Wt 168.0 lb

## 2020-08-18 DIAGNOSIS — Z3009 Encounter for other general counseling and advice on contraception: Secondary | ICD-10-CM

## 2020-08-18 DIAGNOSIS — Z30013 Encounter for initial prescription of injectable contraceptive: Secondary | ICD-10-CM

## 2020-08-18 NOTE — Progress Notes (Signed)
Client tolerated Depo without complaint. .Sidharth Leverette, RN   

## 2020-10-30 IMAGING — CR DG CHEST 2V
1 series · 2 of 2 positions shown · non-contrast
Comparison: None.

CLINICAL DATA: Chest pain.  Difficulty breathing.

EXAM:
CHEST - 2 VIEW

[Series 1: dg chest 2 view · 0.14mm/px · 2 of 2 slices shown]
[im 1/2]
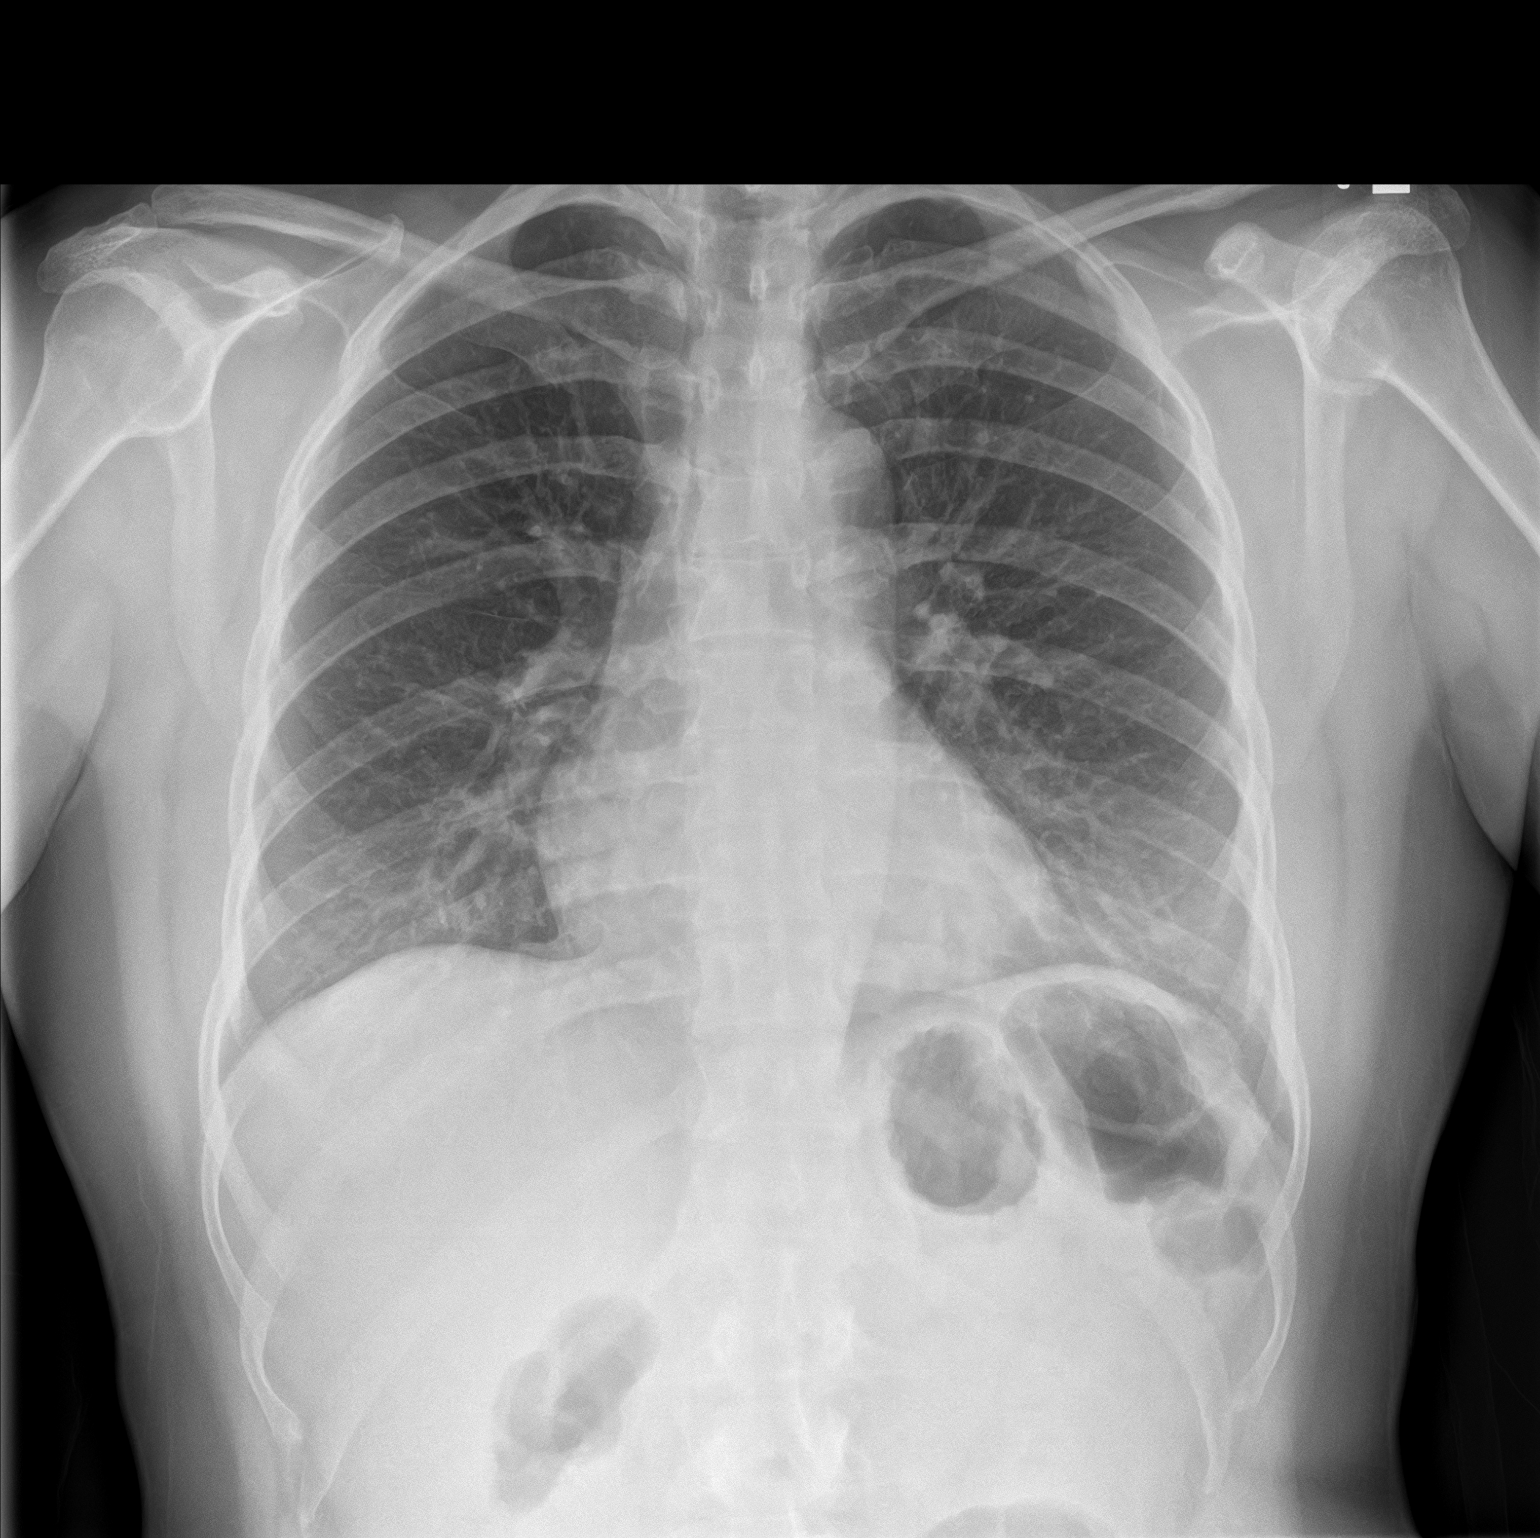
[im 2/2]
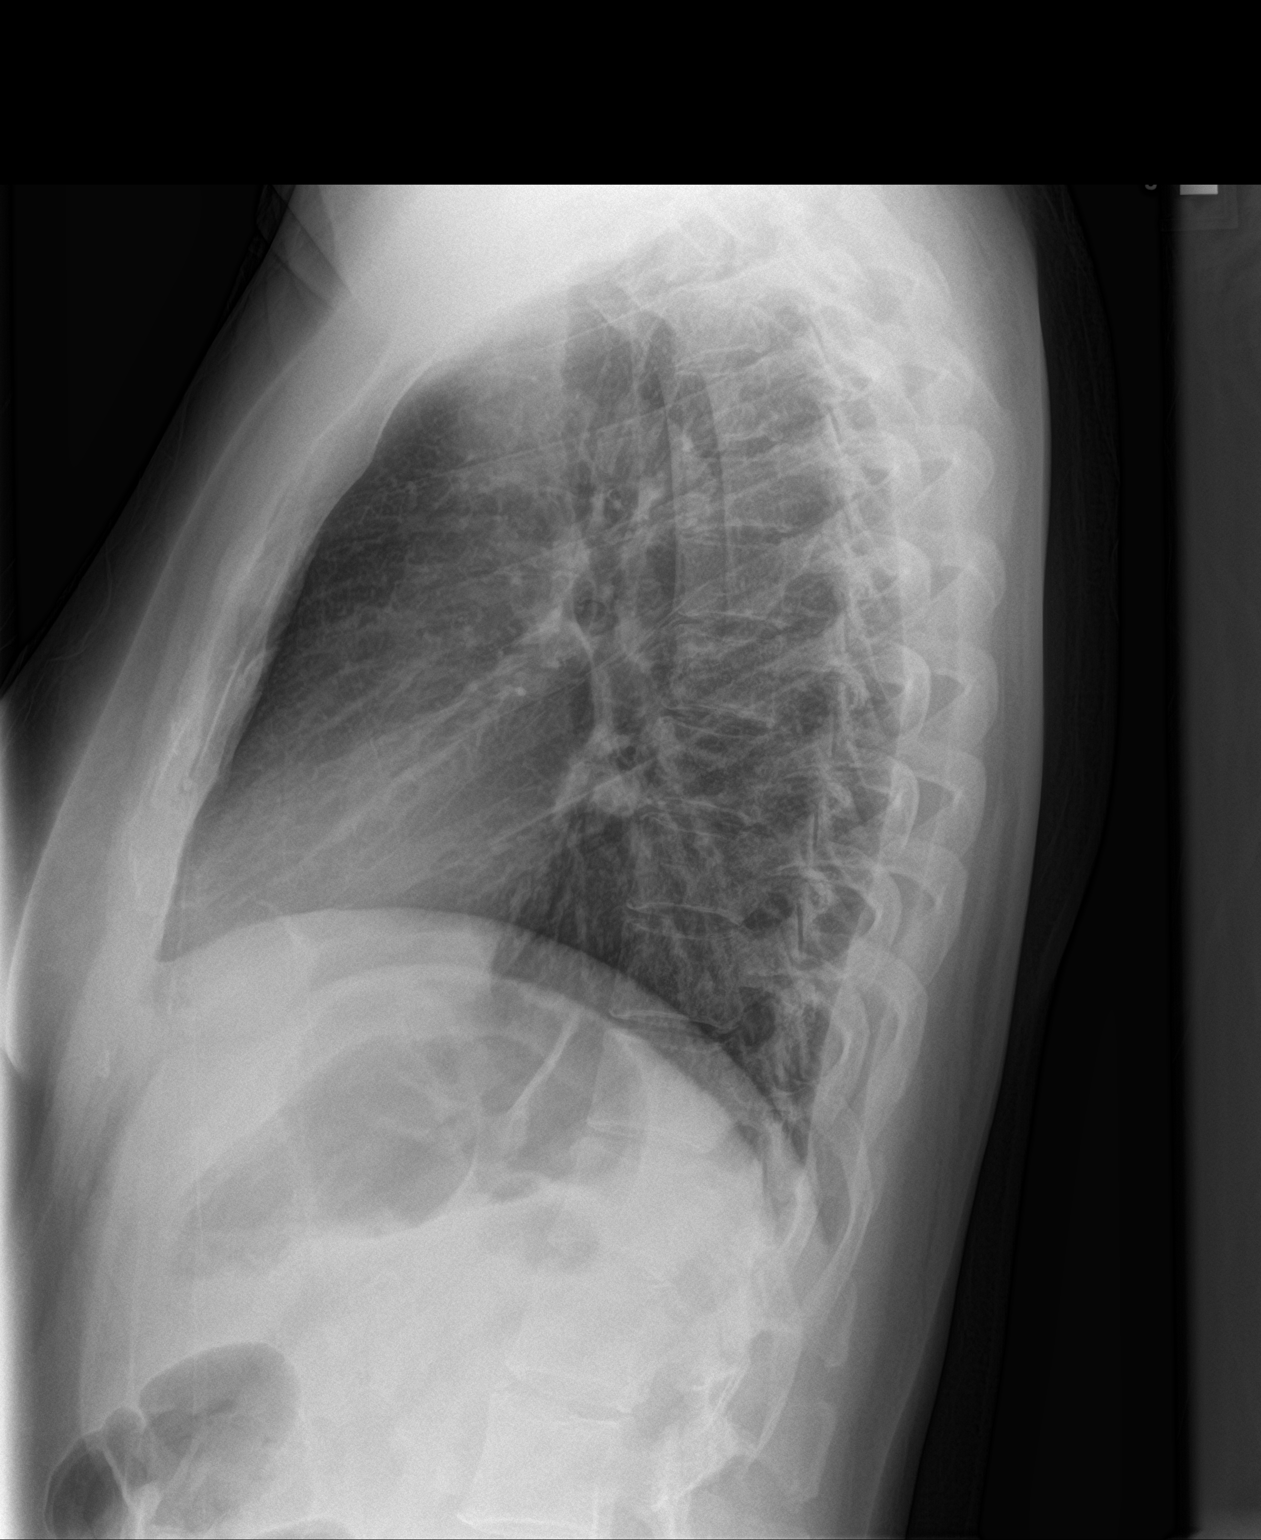

[2 of 2 positions shown; findings below may reference images not displayed]

FINDINGS: There are streaky bibasilar airspace opacities. No pneumothorax. No
large focal infiltrate. No large pleural effusion. The heart size is
normal. There is no acute osseous abnormality.
IMPRESSION: Bibasilar atelectasis, otherwise normal study.

## 2020-11-04 ENCOUNTER — Ambulatory Visit (LOCAL_COMMUNITY_HEALTH_CENTER): Payer: Self-pay

## 2020-11-04 ENCOUNTER — Other Ambulatory Visit: Payer: Self-pay

## 2020-11-04 VITALS — BP 124/73 | HR 96 | Ht 65.0 in | Wt 159.5 lb

## 2020-11-04 DIAGNOSIS — Z30013 Encounter for initial prescription of injectable contraceptive: Secondary | ICD-10-CM

## 2020-11-04 DIAGNOSIS — Z3009 Encounter for other general counseling and advice on contraception: Secondary | ICD-10-CM

## 2020-11-04 NOTE — Progress Notes (Signed)
Client requested Depo in right deltoid and tolerated without complaint. Jossie Ng, RN

## 2021-01-25 ENCOUNTER — Ambulatory Visit: Payer: Self-pay

## 2021-02-01 ENCOUNTER — Other Ambulatory Visit: Payer: Self-pay

## 2021-02-01 ENCOUNTER — Ambulatory Visit (LOCAL_COMMUNITY_HEALTH_CENTER): Payer: Self-pay

## 2021-02-01 VITALS — BP 124/60 | Ht 65.0 in | Wt 155.5 lb

## 2021-02-01 DIAGNOSIS — Z3009 Encounter for other general counseling and advice on contraception: Secondary | ICD-10-CM

## 2021-02-01 DIAGNOSIS — Z3042 Encounter for surveillance of injectable contraceptive: Secondary | ICD-10-CM

## 2021-02-01 MED ORDER — MEDROXYPROGESTERONE ACETATE 150 MG/ML IM SUSP
150.0000 mg | Freq: Once | INTRAMUSCULAR | Status: AC
  Administered 2021-02-01: 150 mg via INTRAMUSCULAR

## 2021-02-01 NOTE — Progress Notes (Signed)
12 weeks 5 days post depo. Lang line, interpreter today. Pt reports itching and fine rash over body that started one month ago. Denies any changes in soaps and foods. Has been using "cream" to help with rash. No PCP. Consult Elveria Rising, FNP who advises pt to maintain adequate hydration and to moisturize skin (ex Aveeno, Cereve, cetaphil) especially during dry winter months. May take OTC antihistamine, such as benadryl to see if helps. Advises pt to establish PCP and to seek evaluation for rash if these measures do not help. May have depo today based on order by Beatris Si, PA dated 03/03/2020. RN discussed provider recommendations with pt. Pt in agreement. Local provider resource info sheet given. Depo administered without problem, right delt (pt preference). Pt due for return physical 02/2021 and plans to have when next depo is due, approx 04/19/21, pt aware. Jerel Shepherd, RN

## 2021-02-02 NOTE — Progress Notes (Signed)
Consulted by RN re: patient situation.  Reviewed RN note and agree that it reflects our discussion and my recommendations.  ° ° °Kristina Bertone, FNP  °

## 2021-04-21 ENCOUNTER — Ambulatory Visit
Admission: EM | Admit: 2021-04-21 | Discharge: 2021-04-21 | Disposition: A | Discharge status: Home / Self Care | Payer: Self-pay

## 2021-04-21 ENCOUNTER — Other Ambulatory Visit: Payer: Self-pay

## 2021-04-21 DIAGNOSIS — L309 Dermatitis, unspecified: Secondary | ICD-10-CM

## 2021-04-21 DIAGNOSIS — L03116 Cellulitis of left lower limb: Secondary | ICD-10-CM

## 2021-04-21 DIAGNOSIS — B353 Tinea pedis: Secondary | ICD-10-CM

## 2021-04-21 MED ORDER — TRIAMCINOLONE ACETONIDE 0.1 % EX CREA: 1.0000 "application " | TOPICAL_CREAM | Freq: Two times a day (BID) | CUTANEOUS | 0 refills | Status: DC

## 2021-04-21 MED ORDER — MEDROXYPROGESTERONE ACETATE 150 MG/ML IM SUSP: 150.0000 mg | INTRAMUSCULAR | 0 refills | Status: DC

## 2021-04-21 MED ORDER — BUTENAFINE HCL 1 % EX CREA: TOPICAL_CREAM | CUTANEOUS | 1 refills | Status: DC

## 2021-04-21 MED ORDER — DOXYCYCLINE HYCLATE 100 MG PO CAPS: 100.0000 mg | ORAL_CAPSULE | Freq: Two times a day (BID) | ORAL | 0 refills | Status: DC

## 2021-04-21 NOTE — Discharge Instructions (Addendum)
Una vez the la piel se ponga normal, use la crema triamcinolone 2 veces al dia for 7 dias Lotrimin puedes usar ahora en la planta y en medio de los dedos Use la crema Nivea despues to banarse.
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
## 2021-04-25 ENCOUNTER — Ambulatory Visit: Payer: Self-pay

## 2021-05-05 ENCOUNTER — Encounter: Payer: Self-pay | Admitting: Family Medicine

## 2021-05-05 ENCOUNTER — Other Ambulatory Visit: Payer: Self-pay

## 2021-05-05 ENCOUNTER — Ambulatory Visit (LOCAL_COMMUNITY_HEALTH_CENTER): Payer: Self-pay | Admitting: Nurse Practitioner

## 2021-05-05 VITALS — BP 132/71 | Ht 64.5 in | Wt 161.4 lb

## 2021-05-05 DIAGNOSIS — Z0000 Encounter for general adult medical examination without abnormal findings: Secondary | ICD-10-CM

## 2021-05-05 DIAGNOSIS — Z3042 Encounter for surveillance of injectable contraceptive: Secondary | ICD-10-CM

## 2021-05-05 DIAGNOSIS — Z309 Encounter for contraceptive management, unspecified: Secondary | ICD-10-CM

## 2021-05-05 DIAGNOSIS — Z3009 Encounter for other general counseling and advice on contraception: Secondary | ICD-10-CM

## 2021-05-05 MED ORDER — MEDROXYPROGESTERONE ACETATE 150 MG/ML IM SUSP
150.0000 mg | INTRAMUSCULAR | Status: AC
  Administered 2021-05-05 – 2021-10-11 (×3): 150 mg via INTRAMUSCULAR

## 2021-05-05 NOTE — Progress Notes (Signed)
Patient here for PE. Depo given. PCP list given to patient. Depo given in L deltoid. Appt reminder card given. ?
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
## 2021-07-21 ENCOUNTER — Ambulatory Visit: Payer: Self-pay

## 2021-07-27 ENCOUNTER — Ambulatory Visit (LOCAL_COMMUNITY_HEALTH_CENTER): Payer: Self-pay

## 2021-07-27 VITALS — BP 110/65 | Ht 64.5 in | Wt 172.5 lb

## 2021-07-27 DIAGNOSIS — Z3009 Encounter for other general counseling and advice on contraception: Secondary | ICD-10-CM

## 2021-07-27 DIAGNOSIS — Z3042 Encounter for surveillance of injectable contraceptive: Secondary | ICD-10-CM

## 2021-07-27 DIAGNOSIS — Z309 Encounter for contraceptive management, unspecified: Secondary | ICD-10-CM

## 2021-07-27 NOTE — Progress Notes (Signed)
Interpretation provided by Marlene [place]  Patient 11 weeks and 6 days post depo.  Voices no concerns.   Depo given today per order by A. White FNP, dated 05/05/2021.  Tolerated well in left deltoid.  Next depo due 10/12/2021.     Brianna Rubel Shelda Pal, RN

## 2021-10-11 ENCOUNTER — Ambulatory Visit (LOCAL_COMMUNITY_HEALTH_CENTER): Payer: Self-pay

## 2021-10-11 VITALS — BP 125/90 | Ht 64.5 in | Wt 173.0 lb

## 2021-10-11 DIAGNOSIS — Z3009 Encounter for other general counseling and advice on contraception: Secondary | ICD-10-CM

## 2021-10-11 DIAGNOSIS — Z3042 Encounter for surveillance of injectable contraceptive: Secondary | ICD-10-CM

## 2021-10-11 NOTE — Progress Notes (Signed)
10 weeks 6 days post Depo.  Voices no concerns.  B/P 123/90 and 125/90 - Larene Pickett, FNP consulted and patient told to monitor blood pressure.  Medroxyprogesterone Acetate 150 mg administered per order dated 05/05/2021 x 1 year by Glenna Fellows, FNP.  Administered in right deltoid.  Tolerated well. Reminder card provided for next visit and instructed an appointment would be needed.  [place] language line used.  [place] # [id number] interpreter.

## 2021-10-12 ENCOUNTER — Ambulatory Visit: Payer: Self-pay

## 2022-01-27 ENCOUNTER — Emergency Department
Admission: EM | Admit: 2022-01-27 | Discharge: 2022-01-27 | Disposition: A | Discharge status: Home / Self Care | Payer: Self-pay

## 2022-02-28 ENCOUNTER — Ambulatory Visit: Payer: Self-pay

## 2022-08-10 ENCOUNTER — Ambulatory Visit (LOCAL_COMMUNITY_HEALTH_CENTER): Payer: Self-pay | Admitting: Family Medicine

## 2022-08-10 ENCOUNTER — Encounter: Payer: Self-pay | Admitting: Family Medicine

## 2022-08-10 VITALS — BP 123/78 | HR 81 | Ht 64.5 in | Wt 185.0 lb

## 2022-08-10 DIAGNOSIS — Z01419 Encounter for gynecological examination (general) (routine) without abnormal findings: Secondary | ICD-10-CM

## 2022-08-10 DIAGNOSIS — Z3009 Encounter for other general counseling and advice on contraception: Secondary | ICD-10-CM

## 2022-08-10 DIAGNOSIS — Z3202 Encounter for pregnancy test, result negative: Secondary | ICD-10-CM

## 2022-08-10 DIAGNOSIS — Z113 Encounter for screening for infections with a predominantly sexual mode of transmission: Secondary | ICD-10-CM

## 2022-08-10 DIAGNOSIS — Z9189 Other specified personal risk factors, not elsewhere classified: Secondary | ICD-10-CM

## 2022-08-10 DIAGNOSIS — Z30013 Encounter for initial prescription of injectable contraceptive: Secondary | ICD-10-CM

## 2022-08-10 DIAGNOSIS — Z8632 Personal history of gestational diabetes: Secondary | ICD-10-CM

## 2022-08-10 DIAGNOSIS — R87629 Unspecified abnormal cytological findings in specimens from vagina: Secondary | ICD-10-CM

## 2022-08-10 LAB — WET PREP FOR TRICH, YEAST, CLUE
Trichomonas Exam: NEGATIVE
Yeast Exam: NEGATIVE

## 2022-08-10 LAB — PREGNANCY, URINE: Preg Test, Ur: NEGATIVE

## 2022-08-10 MED ORDER — MEDROXYPROGESTERONE ACETATE 150 MG/ML IM SUSP
150.0000 mg | INTRAMUSCULAR | Status: AC
Start: 2022-08-10 — End: 2023-11-03
  Administered 2022-08-10 – 2023-08-05 (×4): 150 mg via INTRAMUSCULAR
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
## 2022-08-10 NOTE — Progress Notes (Addendum)
Pt appointment for PE, late Depo, and STI screening. Seen by FNP Sydnee Levans. Family planning packet given and contents reviewed. Wet prep and pregnancy results all negative and reviewed with client. Depo administered per order.

## 2022-08-11 LAB — HGB A1C W/O EAG: Hgb A1c MFr Bld: 6 % — ABNORMAL HIGH (ref 4.8–5.6)

## 2022-08-13 ENCOUNTER — Telehealth: Payer: Self-pay | Admitting: General Practice

## 2022-08-13 ENCOUNTER — Telehealth: Payer: Self-pay

## 2022-08-13 NOTE — Telephone Encounter (Signed)
-----   Message from [place], [place] sent at 08/13/2022  9:06 AM EDT ----- Prediabetes. Please contact patient and encourage seeing a PCP.

## 2022-08-13 NOTE — Telephone Encounter (Signed)
LM for pt to return my call.  Judie Petit [place] assisted with the call

## 2022-08-13 NOTE — Telephone Encounter (Signed)
LM for pt to return my call.  M. [place] assisted with the call 

## 2022-08-13 NOTE — Telephone Encounter (Signed)
Pt had recieved call but didn't not answer and would like a call back

## 2022-08-14 ENCOUNTER — Ambulatory Visit
Admission: EM | Admit: 2022-08-14 | Discharge: 2022-08-14 | Disposition: A | Discharge status: Home / Self Care | Payer: Self-pay | Attending: Nurse Practitioner | Admitting: Nurse Practitioner

## 2022-08-14 ENCOUNTER — Telehealth: Payer: Self-pay

## 2022-08-14 DIAGNOSIS — L740 Miliaria rubra: Secondary | ICD-10-CM

## 2022-08-14 MED ORDER — HYDROXYZINE HCL 25 MG PO TABS
25.0000 mg | ORAL_TABLET | Freq: Three times a day (TID) | ORAL | 0 refills | Status: DC | PRN
Start: 2022-08-14 — End: 2024-01-07

## 2022-08-14 MED ORDER — TRIAMCINOLONE ACETONIDE 0.1 % EX CREA
1.0000 | TOPICAL_CREAM | Freq: Two times a day (BID) | CUTANEOUS | 0 refills | Status: DC
Start: 2022-08-14 — End: 2024-01-07

## 2022-08-14 NOTE — ED Triage Notes (Addendum)
Pt c/o rash in neck,butt & L leg x1 wk. Has tried hydrocortisone cream w/o relief.

## 2022-08-14 NOTE — Telephone Encounter (Signed)
-----   Message from Helena Middleton, FNP sent at 08/13/2022  9:06 AM EDT ----- Prediabetes. Please contact patient and encourage seeing a PCP. 
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
## 2022-08-14 NOTE — Telephone Encounter (Signed)
Pt wants a callback since she missed previous calls due to work, please call her after 3pm when she gets off.

## 2022-08-14 NOTE — Discharge Instructions (Signed)
Topical Kenalog steroid cream to the rash twice daily as needed.  He may also take hydroxyzine every 8 hours as needed for itching.  Please note this medication make you drowsy.  Do not drink alcohol or drive on this medication Follow-up with your PCP if your symptoms do not improve Please go to the ER for any worsening symptoms

## 2022-11-09 ENCOUNTER — Ambulatory Visit (LOCAL_COMMUNITY_HEALTH_CENTER): Payer: Self-pay

## 2022-11-09 VITALS — BP 121/71 | Ht 64.5 in | Wt 186.5 lb

## 2022-11-09 DIAGNOSIS — Z3042 Encounter for surveillance of injectable contraceptive: Secondary | ICD-10-CM

## 2022-11-09 DIAGNOSIS — Z3009 Encounter for other general counseling and advice on contraception: Secondary | ICD-10-CM

## 2022-11-09 NOTE — Progress Notes (Signed)
13 weeks 0 days post depo. Depo given today per order by Aliene Altes, FNP dated 08/10/2022. Tolerated well R delt. Next depo due 01/25/2023, patient aware. Lang UEAV#[id number].   Patient asks about meds she was prescribed by [facility] 07/2022 which she never got and RN counseled her to contact the urgent care for guidance and also her pharmacy.  Questions answered and reports understanding. Jerel Shepherd, RN

## 2022-11-23 ENCOUNTER — Ambulatory Visit
Admission: EM | Admit: 2022-11-23 | Discharge: 2022-11-23 | Disposition: A | Discharge status: Home / Self Care | Payer: Self-pay | Attending: Physician Assistant | Admitting: Physician Assistant

## 2022-11-23 ENCOUNTER — Encounter: Payer: Self-pay | Admitting: Emergency Medicine

## 2022-11-23 DIAGNOSIS — U071 COVID-19: Secondary | ICD-10-CM | POA: Insufficient documentation

## 2022-11-23 DIAGNOSIS — J029 Acute pharyngitis, unspecified: Secondary | ICD-10-CM | POA: Insufficient documentation

## 2022-11-23 DIAGNOSIS — R051 Acute cough: Secondary | ICD-10-CM | POA: Insufficient documentation

## 2022-11-23 LAB — SARS CORONAVIRUS 2 BY RT PCR: SARS Coronavirus 2 by RT PCR: POSITIVE — AB

## 2022-11-23 LAB — GROUP A STREP BY PCR: Group A Strep by PCR: NOT DETECTED

## 2022-11-23 MED ORDER — PROMETHAZINE-DM 6.25-15 MG/5ML PO SYRP: 5.0000 mL | ORAL_SOLUTION | Freq: Four times a day (QID) | ORAL | 0 refills | Status: DC | PRN

## 2022-11-23 NOTE — ED Triage Notes (Signed)
Research officer, trade [employer] # [phone] [place].  Patient c/o headache, boodyaches, and sore throat that started on Tuesday.  Patient states that she got a flu vaccine injection on Tuesday. Patient denies fevers.

## 2022-11-23 NOTE — Discharge Instructions (Addendum)
-  La prueba de COVID es positiva. Aislar hasta que no haya fiebre durante 24 horas y los sntomas mejoren, al menos otros 3 [address]  [address]. -Le envi medicina para la tos. Aumente el descanso y los lquidos y puede continuar con Tylenol o Motrin para Chief Technology Officer. -Acudir a urgencias si presenta fiebre incontrolable, debilidad y dificultad para respirar.  -COVID test is positive. Isolate until no fever for 24 hours and symptoms are improving, at least another 3 days. -I sent cough medicine. Increase rest and fluids and may continue Tylenol or Motrin for pain/aching. -Go to ER for uncontrollable fever, weakness, shortness of breath.
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
## 2023-02-01 ENCOUNTER — Ambulatory Visit: Payer: Self-pay

## 2023-02-01 VITALS — BP 131/63 | Ht 64.49 in | Wt 186.0 lb

## 2023-02-01 DIAGNOSIS — Z3009 Encounter for other general counseling and advice on contraception: Secondary | ICD-10-CM

## 2023-02-01 DIAGNOSIS — Z3042 Encounter for surveillance of injectable contraceptive: Secondary | ICD-10-CM

## 2023-02-01 NOTE — Progress Notes (Signed)
12 Weeks   0 Days since last Depo Interpreter: Osborn Coho   Voices no concerns today.  Counseled to adhere to 11 to 13 week intervals between depo injections for optimal benefit.  Depo given today per order by Aliene Altes, FNP  dated  08/10/2022.  Tolerated well Left delt. Marland Kitchen  Next depo due 04/19/2023 has reminder card. Jerel Shepherd, RN

## 2023-04-19 ENCOUNTER — Ambulatory Visit: Payer: Self-pay

## 2023-08-05 ENCOUNTER — Ambulatory Visit: Payer: Self-pay | Admitting: Family Medicine

## 2023-08-05 VITALS — BP 120/67 | HR 81 | Ht 64.5 in | Wt 174.8 lb

## 2023-08-05 DIAGNOSIS — Z3009 Encounter for other general counseling and advice on contraception: Secondary | ICD-10-CM

## 2023-08-05 DIAGNOSIS — Z3042 Encounter for surveillance of injectable contraceptive: Secondary | ICD-10-CM
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
## 2023-08-05 NOTE — Patient Instructions (Addendum)
 Depo Provera  Today we gave you the injectable birth control called Depo Provera .  Please come back in 11-12 weeks for your next injection.  Depo Provera  Hoy te administramos el anticonceptivo inyectable llamado Depo Provera . Vuelve en 11 o 12 semanas para tu prxima inyeccin.

## 2023-08-05 NOTE — Progress Notes (Signed)
 Pt here for acute visit to restart Depo Provera .  Last intercourse approximately one month ago with condom.  Last menstrual period 07-30-23 and normal per patient.  Per order of Dr. Bohdan Bush, Depo Provera  150mg  IM given in right deltoid without complications.  Advised to use condoms as backup method x 7days.  Condoms given.  Reminder card for next injection and physical given.-Tiena Manansala, RN

## 2023-11-07 ENCOUNTER — Ambulatory Visit: Payer: Self-pay

## 2023-11-18 ENCOUNTER — Ambulatory Visit: Payer: Self-pay

## 2024-01-07 ENCOUNTER — Ambulatory Visit
Admission: EM | Admit: 2024-01-07 | Discharge: 2024-01-07 | Disposition: A | Discharge status: Home / Self Care | Payer: Self-pay | Attending: Emergency Medicine | Admitting: Emergency Medicine

## 2024-01-07 ENCOUNTER — Encounter: Payer: Self-pay | Admitting: Emergency Medicine

## 2024-01-07 DIAGNOSIS — R051 Acute cough: Secondary | ICD-10-CM

## 2024-01-07 DIAGNOSIS — J069 Acute upper respiratory infection, unspecified: Secondary | ICD-10-CM

## 2024-01-07 DIAGNOSIS — H1131 Conjunctival hemorrhage, right eye: Secondary | ICD-10-CM

## 2024-01-07 LAB — POC COVID19/FLU A&B COMBO
Covid Antigen, POC: NEGATIVE
Influenza A Antigen, POC: NEGATIVE
Influenza B Antigen, POC: NEGATIVE

## 2024-01-07 MED ORDER — IPRATROPIUM BROMIDE 0.06 % NA SOLN: 2.0000 | Freq: Four times a day (QID) | NASAL | 12 refills | Status: AC

## 2024-01-07 MED ORDER — BENZONATATE 100 MG PO CAPS: 200.0000 mg | ORAL_CAPSULE | Freq: Three times a day (TID) | ORAL | 0 refills | Status: AC

## 2024-01-07 MED ORDER — PROMETHAZINE-DM 6.25-15 MG/5ML PO SYRP: 5.0000 mL | ORAL_SOLUTION | Freq: Four times a day (QID) | ORAL | 0 refills | Status: AC | PRN

## 2024-01-07 MED ORDER — OPCON-A 0.027-0.315 % OP SOLN: 2.0000 [drp] | Freq: Four times a day (QID) | OPHTHALMIC | 0 refills | Status: AC | PRN
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
## 2024-01-07 NOTE — Discharge Instructions (Addendum)
 Sus pruebas de hoy dieron negativo para COVID-19 e influenza. Su examen es compatible con una infeccin respiratoria viral.  Tome Tylenol y/o ibuprofeno de venta libre segn sea necesario para la fiebre o chief technology officer.  Se le ha diagnosticado una hemorragia subconjuntival, que es la ruptura de un vaso sanguneo en la superficie del globo ocular. Esto se resolver por s solo en un par [employer].  Use Opcon-A, 2 gotas en el ojo derecho cada 6 horas segn sea necesario para la irritacin o picazn.  Si tiene alguna molestia, puede usar Tylenol y/o ibuprofeno de venta libre segn las instrucciones del empaque.  Tambin puede aplicar compresas tibias en el ojo para ayudar a photographer.  Si presenta algn cambio en la visin, hinchazn en el ojo o dolor en el ojo, debe consultar con un oftalmlogo.  Use el aerosol nasal Atrovent, dos pulverizaciones en cada fosa nasal cada 6 horas, segn sea necesario para la secrecin nasal y el goteo posnasal.  Tome las perlas Tessalon cada 8 horas durante el da. Tmelas con un sorbo pequeo de agua. Es posible que sienta un ligero entumecimiento en la base de la lengua o un sabor metlico en la boca; esto es normal.  Tome el jarabe para la tos con prometazina DM antes de acostarse para la tos y sales executive. Le producir somnolencia, as que no lo tome administrator.  Regrese para una reevaluacin o consulte a su mdico de cabecera si presenta sntomas nuevos o si sus sntomas empeoran.  Your testing today was negative for COVID and influenza.  Your exam is consistent with a viral respiratory infection.  Use over-the-counter Tylenol and/or ibuprofen as needed for any fever or pain.  You have been diagnosed with a subconjunctival hemorrhage, which is a blood vessel on the surface of your eyeball that has been ruptured.  This will resolve on its own within a couple of weeks.  Use the Opcon-A, 2 drops in your right eye every 6 hours as needed  for irritation or itching.  If you have any discomfort you can use over-the-counter Tylenol and or ibuprofen according to the package instructions.  You may also apply warm compresses to the eye to help ease redness.  If you develop any change in your vision, swelling to the eye, or you develop pain in your eye you need to follow-up with an eye doctor.   Use the Atrovent nasal spray, 2 squirts in each nostril every 6 hours, as needed for runny nose and postnasal drip.  Use the Tessalon Perles every 8 hours during the day.  Take them with a small sip of water.  They may give you some numbness to the base of your tongue or a metallic taste in your mouth, this is normal.  Use the Promethazine  DM cough syrup at bedtime for cough and congestion.  It will make you drowsy so do not take it during the day.  Return for reevaluation or see your primary care provider for any new or worsening symptoms.

## 2024-01-07 NOTE — ED Triage Notes (Signed)
 Pt presents with right eye redness, cough, runny nose and headache since yesterday. Pt took cold medication last night. SABRA
